# Patient Record
Sex: Male | Born: 1980 | Race: Black or African American | Hispanic: No | Marital: Single | Smoking: Former smoker
Health system: Southern US, Community
[De-identification: ages and names within clinical notes are randomized; demographics above are authoritative.]

---

## 2005-12-31 ENCOUNTER — Emergency Department: Payer: Self-pay | Admitting: Emergency Medicine

## 2007-04-03 ENCOUNTER — Emergency Department: Payer: Self-pay | Admitting: Emergency Medicine

## 2008-10-24 ENCOUNTER — Emergency Department: Payer: Self-pay | Admitting: Emergency Medicine

## 2010-02-21 ENCOUNTER — Emergency Department: Payer: Self-pay | Admitting: Emergency Medicine

## 2010-03-01 ENCOUNTER — Emergency Department: Payer: Self-pay | Admitting: Emergency Medicine

## 2010-07-18 ENCOUNTER — Emergency Department: Payer: Self-pay | Admitting: Emergency Medicine

## 2011-06-11 ENCOUNTER — Emergency Department: Payer: Self-pay | Admitting: Emergency Medicine

## 2011-10-16 ENCOUNTER — Emergency Department: Payer: Self-pay | Admitting: Emergency Medicine

## 2011-10-22 ENCOUNTER — Emergency Department: Payer: Self-pay | Admitting: *Deleted

## 2017-05-15 ENCOUNTER — Other Ambulatory Visit: Payer: Self-pay

## 2017-05-15 ENCOUNTER — Emergency Department: Payer: Self-pay

## 2017-05-15 ENCOUNTER — Emergency Department
Admission: EM | Admit: 2017-05-15 | Discharge: 2017-05-15 | Disposition: A | Discharge status: Home / Self Care | Payer: Self-pay | Attending: Emergency Medicine | Admitting: Emergency Medicine

## 2017-05-15 DIAGNOSIS — Z87891 Personal history of nicotine dependence: Secondary | ICD-10-CM | POA: Insufficient documentation

## 2017-05-15 DIAGNOSIS — W500XXA Accidental hit or strike by another person, initial encounter: Secondary | ICD-10-CM | POA: Insufficient documentation

## 2017-05-15 DIAGNOSIS — S62667A Nondisplaced fracture of distal phalanx of left little finger, initial encounter for closed fracture: Secondary | ICD-10-CM | POA: Insufficient documentation

## 2017-05-15 DIAGNOSIS — Z23 Encounter for immunization: Secondary | ICD-10-CM | POA: Insufficient documentation

## 2017-05-15 DIAGNOSIS — Y998 Other external cause status: Secondary | ICD-10-CM | POA: Insufficient documentation

## 2017-05-15 DIAGNOSIS — S62607A Fracture of unspecified phalanx of left little finger, initial encounter for closed fracture: Secondary | ICD-10-CM

## 2017-05-15 DIAGNOSIS — Y9389 Activity, other specified: Secondary | ICD-10-CM | POA: Insufficient documentation

## 2017-05-15 DIAGNOSIS — Y929 Unspecified place or not applicable: Secondary | ICD-10-CM | POA: Insufficient documentation

## 2017-05-15 MED ORDER — AMOXICILLIN-POT CLAVULANATE 875-125 MG PO TABS: 1.0000 | ORAL_TABLET | Freq: Two times a day (BID) | ORAL | 0 refills | Status: AC

## 2017-05-15 MED ORDER — TETANUS-DIPHTH-ACELL PERTUSSIS 5-2.5-18.5 LF-MCG/0.5 IM SUSP
0.5000 mL | Freq: Once | INTRAMUSCULAR | Status: AC
  Administered 2017-05-15: 0.5 mL via INTRAMUSCULAR
  Filled 2017-05-15: qty 0.5

## 2017-05-15 MED ORDER — TRAMADOL HCL 50 MG PO TABS: 50.0000 mg | ORAL_TABLET | Freq: Four times a day (QID) | ORAL | 0 refills | Status: AC | PRN
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
## 2019-05-13 IMAGING — CR DG HAND COMPLETE 3+V*L*
3 series · 3 of 3 positions shown · non-contrast
Comparison: No recent .

CLINICAL DATA: Injury.

EXAM:
LEFT HAND - COMPLETE 3+ VIEW

[hand ap]
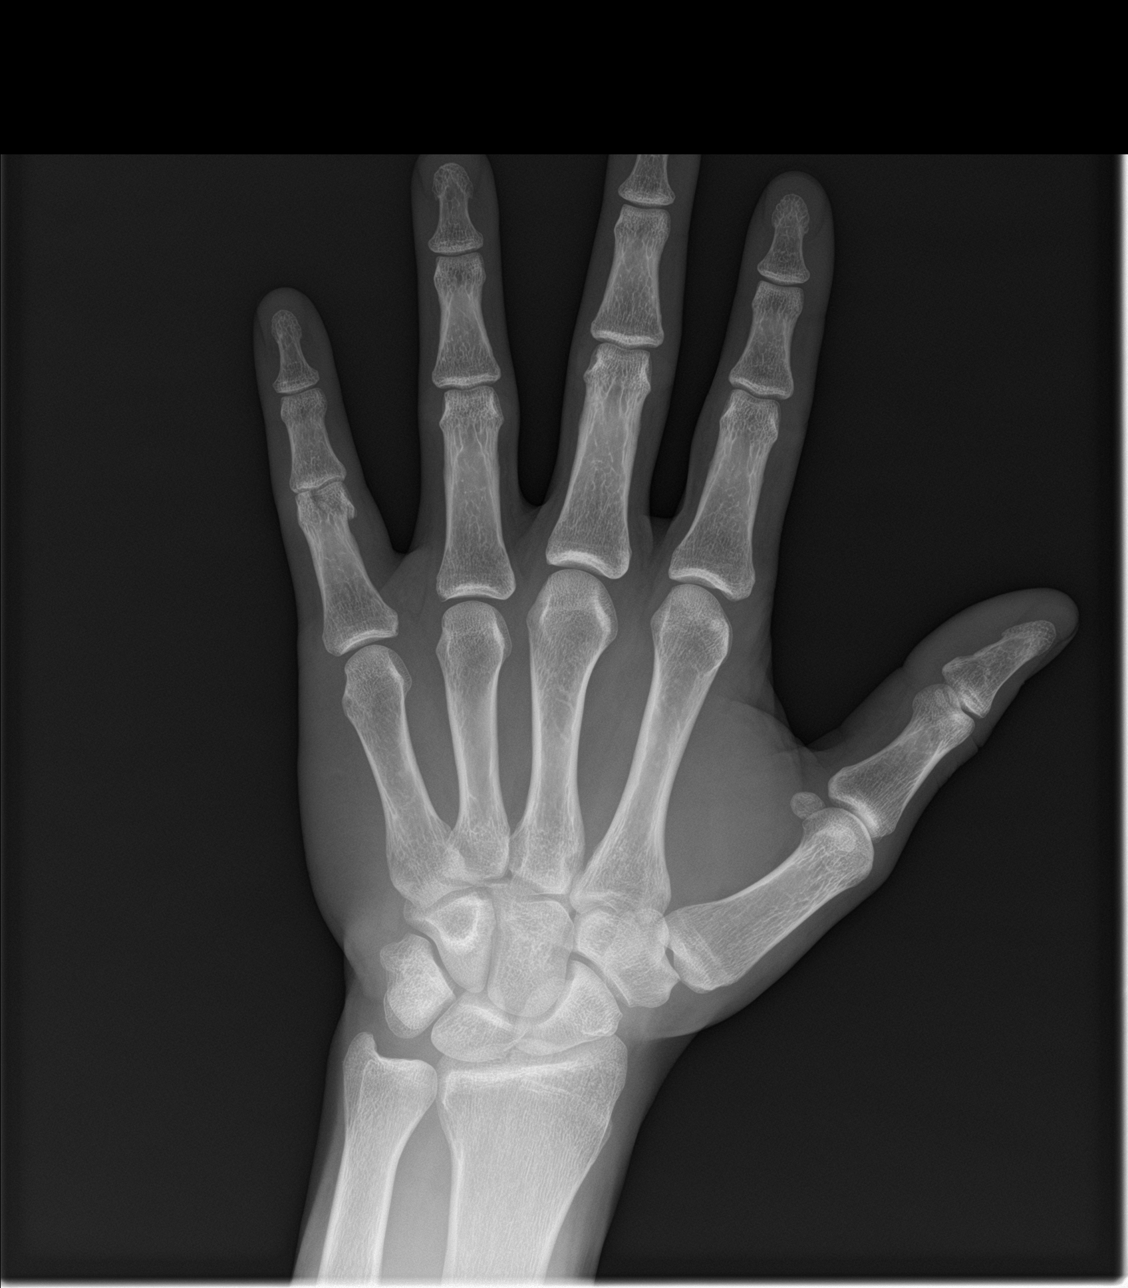

[hand obl]
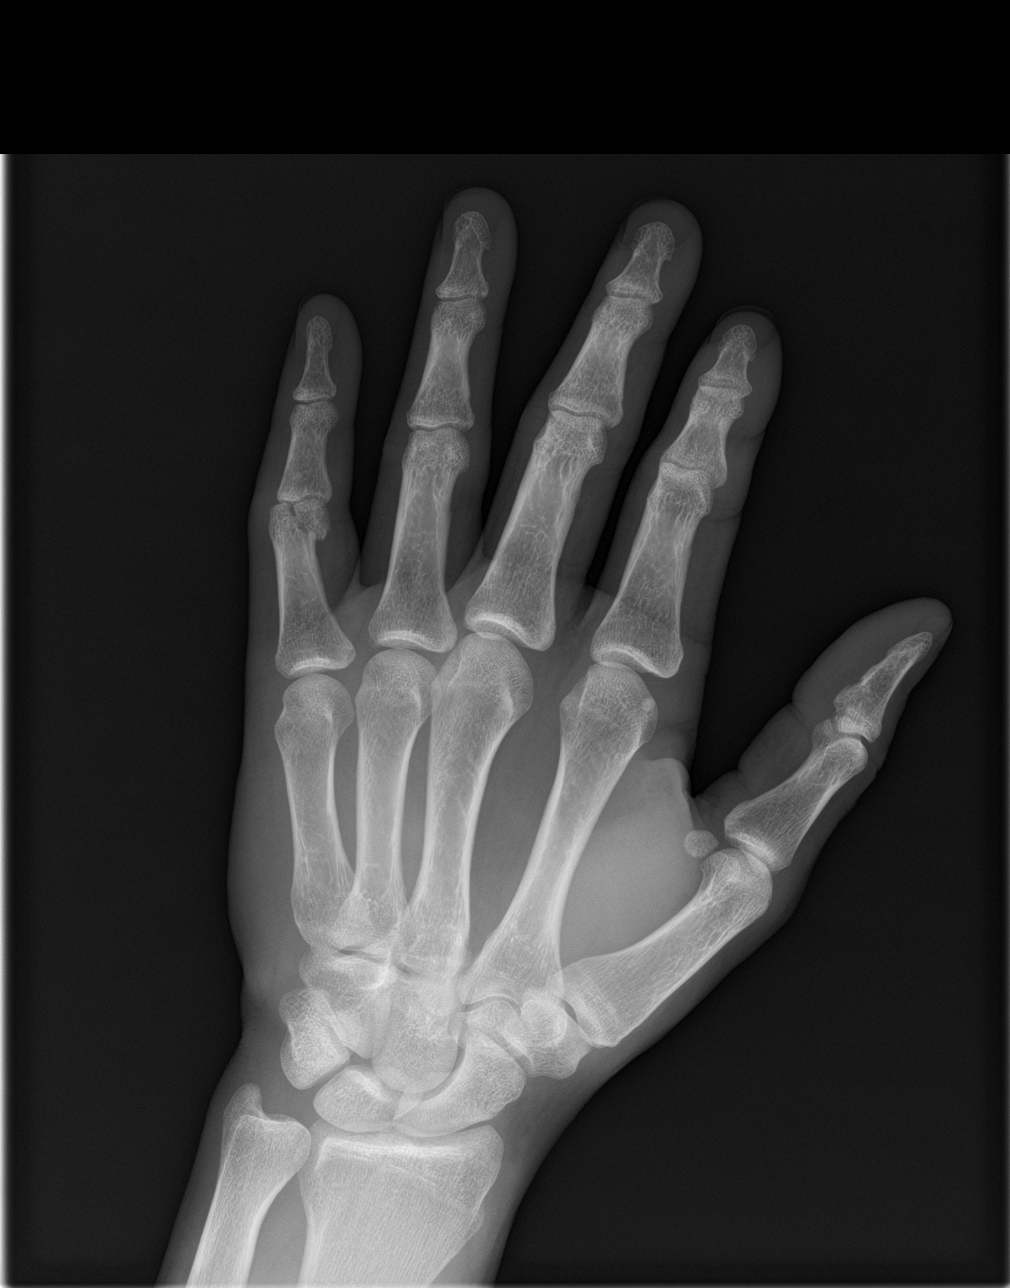

[hand lat]
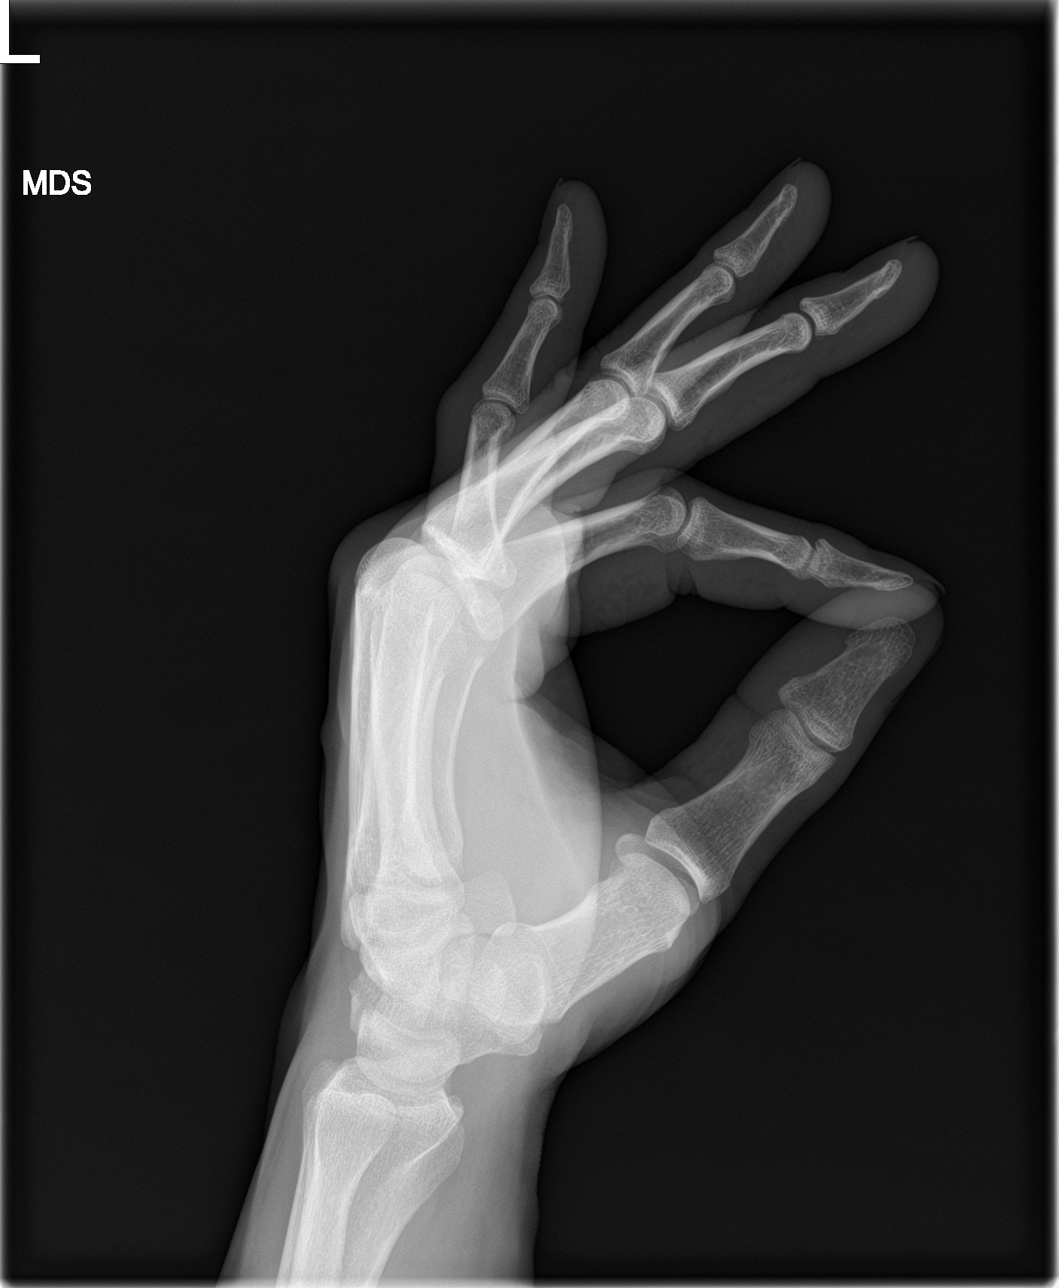

[3 of 3 positions shown; findings below may reference images not displayed]

FINDINGS: Fracture of the distal aspect of the proximal phalanx of the left
fifth digit is noted. The fracture is displaced and extends into the
proximal interphalangeal joint space.
IMPRESSION: Displaced fracture of the distal aspect of the proximal phalanx of
the left fifth digit. Fracture extends into the proximal
interphalangeal joint space.

## 2023-05-13 DIAGNOSIS — R112 Nausea with vomiting, unspecified: Secondary | ICD-10-CM | POA: Diagnosis not present

## 2023-05-13 DIAGNOSIS — R197 Diarrhea, unspecified: Secondary | ICD-10-CM | POA: Diagnosis not present

## 2024-01-16 ENCOUNTER — Other Ambulatory Visit: Payer: Self-pay

## 2024-01-16 ENCOUNTER — Emergency Department
Admission: EM | Admit: 2024-01-16 | Discharge: 2024-01-16 | Disposition: A | Discharge status: Home / Self Care | Payer: Self-pay | Attending: Emergency Medicine | Admitting: Emergency Medicine

## 2024-01-16 DIAGNOSIS — K529 Noninfective gastroenteritis and colitis, unspecified: Secondary | ICD-10-CM | POA: Insufficient documentation

## 2024-01-16 LAB — COMPREHENSIVE METABOLIC PANEL WITH GFR
ALT: 10 U/L (ref 0–44)
AST: 16 U/L (ref 15–41)
Albumin: 3.9 g/dL (ref 3.5–5.0)
Alkaline Phosphatase: 58 U/L (ref 38–126)
Anion gap: 7 (ref 5–15)
BUN: 13 mg/dL (ref 6–20)
CO2: 25 mmol/L (ref 22–32)
Calcium: 9.1 mg/dL (ref 8.9–10.3)
Chloride: 107 mmol/L (ref 98–111)
Creatinine, Ser: 0.78 mg/dL (ref 0.61–1.24)
GFR, Estimated: >60 mL/min (ref >60)
Glucose, Bld: 91 mg/dL (ref 70–99)
Potassium: 4.3 mmol/L (ref 3.5–5.1)
Sodium: 139 mmol/L (ref 135–145)
Total Bilirubin: 0.9 mg/dL (ref 0.0–1.2)
Total Protein: 6.9 g/dL (ref 6.5–8.1)

## 2024-01-16 LAB — CBC WITH DIFFERENTIAL/PLATELET
Abs Immature Granulocytes: 0.01 K/uL (ref 0.00–0.07)
Basophils Absolute: 0.1 K/uL (ref 0.0–0.1)
Basophils Relative: 1 %
Eosinophils Absolute: 0.1 K/uL (ref 0.0–0.5)
Eosinophils Relative: 2 %
HCT: 37.4 % — ABNORMAL LOW (ref 39.0–52.0)
Hemoglobin: 12.8 g/dL — ABNORMAL LOW (ref 13.0–17.0)
Immature Granulocytes: 0 %
Lymphocytes Relative: 26 %
Lymphs Abs: 1.8 K/uL (ref 0.7–4.0)
MCH: 32.1 pg (ref 26.0–34.0)
MCHC: 34.2 g/dL (ref 30.0–36.0)
MCV: 93.7 fL (ref 80.0–100.0)
Monocytes Absolute: 0.3 K/uL (ref 0.1–1.0)
Monocytes Relative: 5 %
Neutro Abs: 4.6 K/uL (ref 1.7–7.7)
Neutrophils Relative %: 66 %
Platelets: 346 K/uL (ref 150–400)
RBC: 3.99 MIL/uL — ABNORMAL LOW (ref 4.22–5.81)
RDW: 13.6 % (ref 11.5–15.5)
WBC: 7 K/uL (ref 4.0–10.5)
nRBC: 0 % (ref 0.0–0.2)

## 2024-01-16 LAB — LIPASE, BLOOD: Lipase: 28 U/L (ref 11–51)

## 2024-01-16 MED ORDER — ONDANSETRON 4 MG PO TBDP: 4.0000 mg | ORAL_TABLET | Freq: Three times a day (TID) | ORAL | 0 refills | Status: AC | PRN

## 2024-01-16 MED ORDER — ONDANSETRON 4 MG PO TBDP
4.0000 mg | ORAL_TABLET | Freq: Once | ORAL | Status: AC
  Administered 2024-01-16: 4 mg via ORAL
  Filled 2024-01-16: qty 1

## 2024-01-16 NOTE — ED Provider Notes (Signed)
 [facility] Provider Note    Event Date/Time   First MD Initiated Contact with Patient 01/16/24 1105     (approximate)   History   Chief Complaint Emesis   HPI  Alan Stephens is a 43 y.o. male with no significant past medical history who presents to the ED complaining of nausea and vomiting.  Patient reports that he has had about 3 days of persistent nausea, vomiting, and diarrhea.  He states that he had some difficulty keeping down liquids or solids over the past 2 days, but has been feeling better thus far this morning, able to drink water without difficulty.  He denies any associated fevers, abdominal pain, or blood in his emesis or stool.  He is not aware of any sick contacts, denies any new foods.  He has not had any urinary symptoms.     Physical Exam   Triage Vital Signs: ED Triage Vitals  Encounter Vitals Group     BP 01/16/24 1043 113/83     Girls Systolic BP Percentile --      Girls Diastolic BP Percentile --      Boys Systolic BP Percentile --      Boys Diastolic BP Percentile --      Pulse Rate 01/16/24 1043 74     Resp 01/16/24 1043 18     Temp 01/16/24 1043 98.4 F (36.9 C)     Temp src --      SpO2 01/16/24 1043 100 %     Weight 01/16/24 1041 155 lb (70.3 kg)     Height 01/16/24 1041 5' 8 (1.727 m)     Head Circumference --      Peak Flow --      Pain Score 01/16/24 1041 0     Pain Loc --      Pain Education --      Exclude from Growth Chart --     Most recent vital signs: Vitals:   01/16/24 1043 01/16/24 1116  BP: 113/83   Pulse: 74   Resp: 18   Temp: 98.4 F (36.9 C)   SpO2: 100% 100%    Constitutional: Alert and oriented. Eyes: Conjunctivae are normal. Head: Atraumatic. Nose: No congestion/rhinnorhea. Mouth/Throat: Mucous membranes are moist.  Cardiovascular: Normal rate, regular rhythm. Grossly normal heart sounds.  2+ radial pulses bilaterally. Respiratory: Normal respiratory effort.  No retractions. Lungs  CTAB. Gastrointestinal: Soft and nontender. No distention. Musculoskeletal: No lower extremity tenderness nor edema.  Neurologic:  Normal speech and language. No gross focal neurologic deficits are appreciated.    ED Results / Procedures / Treatments   Labs (all labs ordered are listed, but only abnormal results are displayed) Labs Reviewed  CBC WITH DIFFERENTIAL/PLATELET - Abnormal; Notable for the following components:      Result Value   RBC 3.99 (*)    Hemoglobin 12.8 (*)    HCT 37.4 (*)    All other components within normal limits  COMPREHENSIVE METABOLIC PANEL WITH GFR  LIPASE, BLOOD     PROCEDURES:  Critical Care performed: No  Procedures   MEDICATIONS ORDERED IN ED: Medications  ondansetron  (ZOFRAN -ODT) disintegrating tablet 4 mg (4 mg Oral Given 01/16/24 1135)     IMPRESSION / MDM / ASSESSMENT AND PLAN / ED COURSE  I reviewed the triage vital signs and the nursing notes.  43 y.o. male with no significant past medical history who presents to the ED complaining of 3 days of nausea, vomiting, and diarrhea.  Patient's presentation is most consistent with acute presentation with potential threat to life or bodily function.  Differential diagnosis includes, but is not limited to, gastroenteritis, dehydration, electrolyte abnormality, AKI, hepatitis, pancreatitis.  Patient nontoxic-appearing and in no acute distress, vital signs are unremarkable.  He has a benign abdominal exam and symptoms consistent with gastroenteritis, lab results are pending at this time.  He states that he is overall feeling better, will give dose of ODT Zofran  and reassess following lab results.  Labs are reassuring with no significant anemia, leukocytosis, electrolyte abnormality, or AKI.  Patient feeling better following dose of Zofran  and is tolerating oral intake without difficulty.  He is appropriate for discharge home with outpatient follow-up, was counseled to  return to the ED for new or worsening symptoms.  Patient agrees with plan.      FINAL CLINICAL IMPRESSION(S) / ED DIAGNOSES   Final diagnoses:  Gastroenteritis     Rx / DC Orders   ED Discharge Orders          Ordered    ondansetron  (ZOFRAN -ODT) 4 MG disintegrating tablet  Every 8 hours PRN        01/16/24 1252             Note:  This document was prepared using Dragon voice recognition software and may include unintentional dictation errors.   Willo Dunnings, MD 01/16/24 [phone]

## 2024-01-16 NOTE — ED Triage Notes (Signed)
 Pt comes with vomiting and diarrhea for few days. Pt states no cough or cold symptoms.
